# Patient Record
Sex: Male | Born: 1990 | Race: White | Hispanic: No | Marital: Single | Smoking: Former smoker
Health system: Southern US, Community
[De-identification: ages and names within clinical notes are randomized; demographics above are authoritative.]

## PROBLEM LIST (undated history)

## (undated) DIAGNOSIS — F329 Major depressive disorder, single episode, unspecified: Secondary | ICD-10-CM

## (undated) DIAGNOSIS — Z8489 Family history of other specified conditions: Secondary | ICD-10-CM

## (undated) DIAGNOSIS — F32A Depression, unspecified: Secondary | ICD-10-CM

## (undated) HISTORY — PX: VASECTOMY: SHX75

---
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
## 2018-12-07 ENCOUNTER — Encounter (HOSPITAL_COMMUNITY): Payer: Self-pay

## 2018-12-07 ENCOUNTER — Other Ambulatory Visit: Payer: Self-pay

## 2018-12-07 ENCOUNTER — Emergency Department (HOSPITAL_COMMUNITY)
Admission: EM | Admit: 2018-12-07 | Discharge: 2018-12-07 | Disposition: A | Discharge status: Home / Self Care | Payer: Self-pay | Attending: Emergency Medicine | Admitting: Emergency Medicine

## 2018-12-07 DIAGNOSIS — F1910 Other psychoactive substance abuse, uncomplicated: Secondary | ICD-10-CM | POA: Insufficient documentation

## 2018-12-07 DIAGNOSIS — F329 Major depressive disorder, single episode, unspecified: Secondary | ICD-10-CM | POA: Insufficient documentation

## 2018-12-07 DIAGNOSIS — F410 Panic disorder [episodic paroxysmal anxiety] without agoraphobia: Secondary | ICD-10-CM | POA: Insufficient documentation

## 2018-12-07 DIAGNOSIS — R45851 Suicidal ideations: Secondary | ICD-10-CM | POA: Insufficient documentation

## 2018-12-07 DIAGNOSIS — Z046 Encounter for general psychiatric examination, requested by authority: Secondary | ICD-10-CM | POA: Insufficient documentation

## 2018-12-07 DIAGNOSIS — Z87891 Personal history of nicotine dependence: Secondary | ICD-10-CM | POA: Insufficient documentation

## 2018-12-07 HISTORY — DX: Major depressive disorder, single episode, unspecified: F32.9

## 2018-12-07 HISTORY — DX: Depression, unspecified: F32.A

## 2018-12-07 LAB — CBC
HCT: 40.1 % (ref 39.0–52.0)
Hemoglobin: 13.1 g/dL (ref 13.0–17.0)
MCH: 28.7 pg (ref 26.0–34.0)
MCHC: 32.7 g/dL (ref 30.0–36.0)
MCV: 87.7 fL (ref 80.0–100.0)
Platelets: 258 10*3/uL (ref 150–400)
RBC: 4.57 MIL/uL (ref 4.22–5.81)
RDW: 12.1 % (ref 11.5–15.5)
WBC: 7.1 10*3/uL (ref 4.0–10.5)
nRBC: 0 % (ref 0.0–0.2)

## 2018-12-07 LAB — COMPREHENSIVE METABOLIC PANEL
ALT: 26 U/L (ref 0–44)
AST: 36 U/L (ref 15–41)
Albumin: 4.3 g/dL (ref 3.5–5.0)
Alkaline Phosphatase: 67 U/L (ref 38–126)
Anion gap: 10 (ref 5–15)
BUN: 9 mg/dL (ref 6–20)
CO2: 29 mmol/L (ref 22–32)
Calcium: 9.4 mg/dL (ref 8.9–10.3)
Chloride: 100 mmol/L (ref 98–111)
Creatinine, Ser: 0.8 mg/dL (ref 0.61–1.24)
GFR calc Af Amer: >60 mL/min (ref >60)
GFR calc non Af Amer: >60 mL/min (ref >60)
Glucose, Bld: 83 mg/dL (ref 70–99)
Potassium: 3.5 mmol/L (ref 3.5–5.1)
Sodium: 139 mmol/L (ref 135–145)
Total Bilirubin: 0.5 mg/dL (ref 0.3–1.2)
Total Protein: 7.4 g/dL (ref 6.5–8.1)

## 2018-12-07 LAB — RAPID URINE DRUG SCREEN, HOSP PERFORMED
Amphetamines: NOT DETECTED
Barbiturates: NOT DETECTED
Benzodiazepines: POSITIVE — AB
Cocaine: NOT DETECTED
Opiates: NOT DETECTED
Tetrahydrocannabinol: POSITIVE — AB

## 2018-12-07 LAB — ACETAMINOPHEN LEVEL: Acetaminophen (Tylenol), Serum: <10 ug/mL — ABNORMAL LOW (ref 10–30)

## 2018-12-07 LAB — SALICYLATE LEVEL: Salicylate Lvl: <7 mg/dL (ref 2.8–30.0)

## 2018-12-07 LAB — ETHANOL: Alcohol, Ethyl (B): <10 mg/dL (ref <10)

## 2018-12-07 NOTE — ED Notes (Signed)
Patient verbalizes understanding of discharge instructions. Opportunity for questioning and answers were provided. Armband removed by staff, pt discharged from ED ambulatory with a Cab Volcher.

## 2018-12-07 NOTE — ED Provider Notes (Signed)
[facility] Provider Note  CSN: [id number] Arrival date & time: 12/07/18 0003  Chief Complaint(s) Panic Attack and Addiction Problem  HPI Douglas Santiago is a 28 y.o. male    Mental Health Problem  Presenting symptoms: depression and suicidal threats   Presenting symptoms: no aggressive behavior, no agitation, no bizarre behavior, no hallucinations, no homicidal ideas, no paranoid behavior, no self-mutilation and no suicide attempt   Degree of incapacity (severity):  Moderate Onset quality:  Gradual Duration: several months. Timing:  Intermittent Progression:  Waxing and waning Chronicity:  Recurrent Context: alcohol use and drug abuse   Treatment compliance:  Untreated Relieved by:  Nothing Worsened by:  Nothing Associated symptoms: anxiety   Associated symptoms: no anhedonia, not distractible, no euphoric mood, no fatigue, no headaches, no hypersomnia, no insomnia, no irritability, no poor judgment and no school problems   Risk factors: no hx of mental illness and no recent psychiatric admission     Past Medical History Past Medical History:  Diagnosis Date  . Depression    There are no active problems to display for this patient.  Home Medication(s) Prior to Admission medications   Not on File                                                                                                                                    Past Surgical History History reviewed. No pertinent surgical history. Family History No family history on file.  Social History Social History   Tobacco Use  . Smoking status: Former Games developermoker  . Smokeless tobacco: Never Used  Substance Use Topics  . Alcohol use: Yes  . Drug use: Yes   Allergies Patient has no known allergies.  Review of Systems Review of Systems  Constitutional: Negative for fatigue and irritability.  Neurological: Negative for headaches.  Psychiatric/Behavioral: Negative for  agitation, hallucinations, homicidal ideas, paranoia and self-injury. The patient is nervous/anxious. The patient does not have insomnia.    All other systems are reviewed and are negative for acute change except as noted in the HPI  Physical Exam Vital Signs  I have reviewed the triage vital signs BP (!) 160/87   Pulse 93   Temp 98.2 F (36.8 C) (Oral)   Resp 18   SpO2 98%   Physical Exam Vitals signs reviewed.  Constitutional:      General: He is not in acute distress.    Appearance: He is well-developed. He is obese. He is not diaphoretic.  HENT:     Head: Normocephalic and atraumatic.     Jaw: No trismus.     Right Ear: External ear normal.     Left Ear: External ear normal.     Nose: Nose normal.  Eyes:     General: No scleral icterus.    Conjunctiva/sclera: Conjunctivae normal.  Neck:     Musculoskeletal: Normal range of motion.  Trachea: Phonation normal.  Cardiovascular:     Rate and Rhythm: Normal rate and regular rhythm.  Pulmonary:     Effort: Pulmonary effort is normal. No respiratory distress.     Breath sounds: No stridor.  Abdominal:     General: There is no distension.  Musculoskeletal: Normal range of motion.  Neurological:     Mental Status: He is alert and oriented to person, place, and time.  Psychiatric:        Attention and Perception: Attention normal.        Mood and Affect: Mood normal.        Speech: Speech normal.        Behavior: Behavior normal.        Thought Content: Thought content is not paranoid or delusional. Thought content includes suicidal ideation. Thought content does not include homicidal ideation. Thought content does not include homicidal or suicidal plan.     ED Results and Treatments Labs (all labs ordered are listed, but only abnormal results are displayed) Labs Reviewed  ACETAMINOPHEN LEVEL - Abnormal; Notable for the following components:      Result Value   Acetaminophen (Tylenol), Serum <10 (*)    All other  components within normal limits  RAPID URINE DRUG SCREEN, HOSP PERFORMED - Abnormal; Notable for the following components:   Benzodiazepines POSITIVE (*)    Tetrahydrocannabinol POSITIVE (*)    All other components within normal limits  COMPREHENSIVE METABOLIC PANEL  ETHANOL  SALICYLATE LEVEL  CBC                                                                                                                         EKG  EKG Interpretation  Date/Time:  Friday Dec 07 2018 03:01:26 EDT Ventricular Rate:  88 PR Interval:    QRS Duration: 95 QT Interval:  354 QTC Calculation: 429 R Axis:   16 Text Interpretation:  Sinus rhythm Probable left ventricular hypertrophy Borderline T abnormalities, inferior leads NO STEMI. No old tracing to compare Confirmed by Drema Pry [phone]) on 12/07/2018 3:03:26 AM      Radiology No results found. Pertinent labs & imaging results that were available during my care of the patient were reviewed by me and considered in my medical decision making (see chart for details).  Medications Ordered in ED Medications - No data to display  Procedures Procedures  (including critical care time)  Medical Decision Making / ED Course I have reviewed the nursing notes for this encounter and the patient's prior records (if available in EHR or on provided paperwork).    Patient presents with intermittent suicidal ideations in the setting of polysubstance abuse.  Patient reports that he has been trying to cut down his opiate use over the past several months by tapering down.  States that he is to take 160 mg of oxycodone per day.  Now taken approximately 30 mg.  He denies any active suicidal plans.  Denies any homicidal ideations or AVH.  Endorses frequent panic attacks.   Patient has forward thinking and does not appear to be a threat to  himself or others.  Believe he is appropriate for outpatient management.  Will provide him with resources for behavioral health and substance abuse.  The patient appears reasonably screened and/or stabilized for discharge and I doubt any other medical condition or other [facility] requiring further screening, evaluation, or treatment in the ED at this time prior to discharge.  The patient is safe for discharge with strict return precautions.   Final Clinical Impression(s) / ED Diagnoses Final diagnoses:  Panic attack  Polysubstance abuse (HCC)  Suicidal ideation    Disposition: Discharge  Condition: Good  I have discussed the results, Dx and Tx plan with the patient who expressed understanding and agree(s) with the plan. Discharge instructions discussed at great length. The patient was given strict return precautions who verbalized understanding of the instructions. No further questions at time of discharge.    ED Discharge Orders    None       Follow Up: [address] [place] [address] [phone]  Call       This chart was dictated using voice recognition software.  Despite best efforts to proofread,  errors can occur which can change the documentation meaning.   Nira Conn, MD 12/07/18 [phone]

## 2018-12-07 NOTE — ED Triage Notes (Signed)
Pt states that he has been having anxiety attacks since January worse today, pt states that he has been addicted to opioids and taking 150mg  a day and is now trying to taper himself off. Reports that he has not drank in 4 days, normally drink 2-6 beers a day. Reports SI thoughts, denies a certain plan, denies HI, denies AVH

## 2019-09-20 ENCOUNTER — Encounter (HOSPITAL_COMMUNITY): Payer: Self-pay

## 2019-09-20 ENCOUNTER — Emergency Department (HOSPITAL_COMMUNITY): Payer: 59

## 2019-09-20 ENCOUNTER — Other Ambulatory Visit: Payer: Self-pay

## 2019-09-20 ENCOUNTER — Emergency Department (HOSPITAL_COMMUNITY)
Admission: EM | Admit: 2019-09-20 | Discharge: 2019-09-21 | Disposition: A | Discharge status: Home / Self Care | Payer: 59 | Attending: Emergency Medicine | Admitting: Emergency Medicine

## 2019-09-20 DIAGNOSIS — R0789 Other chest pain: Secondary | ICD-10-CM | POA: Insufficient documentation

## 2019-09-20 DIAGNOSIS — Z87891 Personal history of nicotine dependence: Secondary | ICD-10-CM | POA: Insufficient documentation

## 2019-09-20 DIAGNOSIS — R079 Chest pain, unspecified: Secondary | ICD-10-CM

## 2019-09-20 LAB — CBC
HCT: 48.4 % (ref 39.0–52.0)
Hemoglobin: 15.8 g/dL (ref 13.0–17.0)
MCH: 29 pg (ref 26.0–34.0)
MCHC: 32.6 g/dL (ref 30.0–36.0)
MCV: 89 fL (ref 80.0–100.0)
Platelets: 277 10*3/uL (ref 150–400)
RBC: 5.44 MIL/uL (ref 4.22–5.81)
RDW: 13.2 % (ref 11.5–15.5)
WBC: 6.4 10*3/uL (ref 4.0–10.5)
nRBC: 0 % (ref 0.0–0.2)

## 2019-09-20 LAB — BASIC METABOLIC PANEL
Anion gap: 11 (ref 5–15)
BUN: 10 mg/dL (ref 6–20)
CO2: 25 mmol/L (ref 22–32)
Calcium: 9.8 mg/dL (ref 8.9–10.3)
Chloride: 103 mmol/L (ref 98–111)
Creatinine, Ser: 0.69 mg/dL (ref 0.61–1.24)
GFR calc Af Amer: >60 mL/min (ref >60)
GFR calc non Af Amer: >60 mL/min (ref >60)
Glucose, Bld: 130 mg/dL — ABNORMAL HIGH (ref 70–99)
Potassium: 3.9 mmol/L (ref 3.5–5.1)
Sodium: 139 mmol/L (ref 135–145)

## 2019-09-20 LAB — TROPONIN I (HIGH SENSITIVITY): Troponin I (High Sensitivity): <2 ng/L (ref <18)

## 2019-09-20 NOTE — ED Triage Notes (Signed)
Pt reports brief episode of sharp chest pain that radiated to his right side of his back,lasting a few seconds. This episode happened while lifting a heavy box. Pt anxious during triage.

## 2019-09-20 NOTE — ED Provider Notes (Signed)
[facility] EMERGENCY DEPARTMENT Provider Note   CSN: [id number] Arrival date & time: 09/20/19  1226     History Chief Complaint  Patient presents with  . Chest Pain    Douglas Santiago is a 29 y.o. male.  HPI Patient with chest pain.  States he was at work where he been lifting heavy things.  He said not right when he lifted it but shortly after developed a sharp pain that started in his chest and went down to his back.  Lasted a few seconds and improved.  Patient nervous and does have a history anxiety.  Worried about his heart.  States he has not had pains like this before.  Former smoker.  Also history of opiate use/abuse, however states he never injected.  No fevers.  No trauma.  No numbness or weakness.  Feels better now.  No shortness of breath.    Past Medical History:  Diagnosis Date  . Depression     There are no problems to display for this patient.   History reviewed. No pertinent surgical history.     No family history on file.  Social History   Tobacco Use  . Smoking status: Former Games developer  . Smokeless tobacco: Never Used  Substance Use Topics  . Alcohol use: Yes  . Drug use: Yes    Home Medications Prior to Admission medications   Not on File    Allergies    Patient has no known allergies.  Review of Systems   Review of Systems  Constitutional: Negative for appetite change.  HENT: Negative for congestion.   Respiratory: Negative for shortness of breath.   Cardiovascular: Positive for chest pain.  Gastrointestinal: Negative for abdominal pain.  Genitourinary: Negative for flank pain.  Musculoskeletal: Negative for back pain.  Skin: Negative for rash.  Neurological: Negative for weakness.  Psychiatric/Behavioral: Negative for confusion.    Physical Exam Updated Vital Signs BP (!) 161/94 (BP Location: Right Arm)   Pulse 98   Temp 98.4 F (36.9 C) (Oral)   Resp 18   SpO2 99%   Physical Exam Vitals reviewed.  HENT:    Head: Normocephalic.  Cardiovascular:     Rate and Rhythm: Normal rate.  Pulmonary:     Effort: Pulmonary effort is normal.  Chest:     Chest wall: No tenderness.  Abdominal:     Tenderness: There is no abdominal tenderness.  Musculoskeletal:     Cervical back: Neck supple.     Comments: No cervical thoracic or lumbar tenderness.  Skin:    General: Skin is warm.     Capillary Refill: Capillary refill takes less than 2 seconds.  Neurological:     Mental Status: He is alert and oriented to person, place, and time.     ED Results / Procedures / Treatments   Labs (all labs ordered are listed, but only abnormal results are displayed) Labs Reviewed  BASIC METABOLIC PANEL - Abnormal; Notable for the following components:      Result Value   Glucose, Bld 130 (*)    All other components within normal limits  CBC  TROPONIN I (HIGH SENSITIVITY)  TROPONIN I (HIGH SENSITIVITY)    EKG EKG Interpretation  Date/Time:  Friday September 20 2019 13:16:16 EST Ventricular Rate:  99 PR Interval:    QRS Duration: 97 QT Interval:  314 QTC Calculation: 403 R Axis:   55 Text Interpretation: Sinus rhythm Confirmed by Benjiman Core [phone]) on 09/20/2019 2:19:24 PM  Radiology DG Chest 2 View  Result Date: 09/20/2019 CLINICAL DATA:  Chest pain. EXAM: CHEST - 2 VIEW COMPARISON:  No prior. FINDINGS: Mild prominence of the anterior mediastinum most likely prominent mediastinal fat. Hilar structures normal. Lungs are clear. No pleural effusion or pneumothorax. Heart size normal. Thoracic spine scoliosis. Mild lower thoracic vertebral body compression fractures, age undetermined. IMPRESSION: 1.  No acute cardiopulmonary disease. 2. Mild lower thoracic vertebral body compression fractures, age undetermined. Electronically Signed   By: Marcello Moores  Register   On: 09/20/2019 13:06    Procedures Procedures (including critical care time)  Medications Ordered in ED Medications - No data to display   ED Course  I have reviewed the triage vital signs and the nursing notes.  Pertinent labs & imaging results that were available during my care of the patient were reviewed by me and considered in my medical decision making (see chart for details).    MDM Rules/Calculators/A&P                      Patient with acute sharp chest pain.  Resolved.  EKG reassuring.  X-ray showed potentially thoracic compression fractures of undetermined age.  He is not tender at this time I do not think these are acute.  Patient does not have memory of a fall or any injury that could have caused back injury.  Will discharge home with outpatient follow-up. Final Clinical Impression(s) / ED Diagnoses Final diagnoses:  Nonspecific chest pain    Rx / DC Orders ED Discharge Orders    None       Davonna Belling, MD 09/20/19 1610

## 2019-09-20 NOTE — Discharge Instructions (Addendum)
Follow-up with your primary care doctor as needed

## 2019-09-30 ENCOUNTER — Ambulatory Visit: Payer: 59 | Admitting: Registered"

## 2020-11-26 IMAGING — DX DG CHEST 2V
2 series · 2 of 2 positions shown · non-contrast
Comparison: No prior.

CLINICAL DATA: Chest pain.

EXAM:
CHEST - 2 VIEW

[w chest pa]
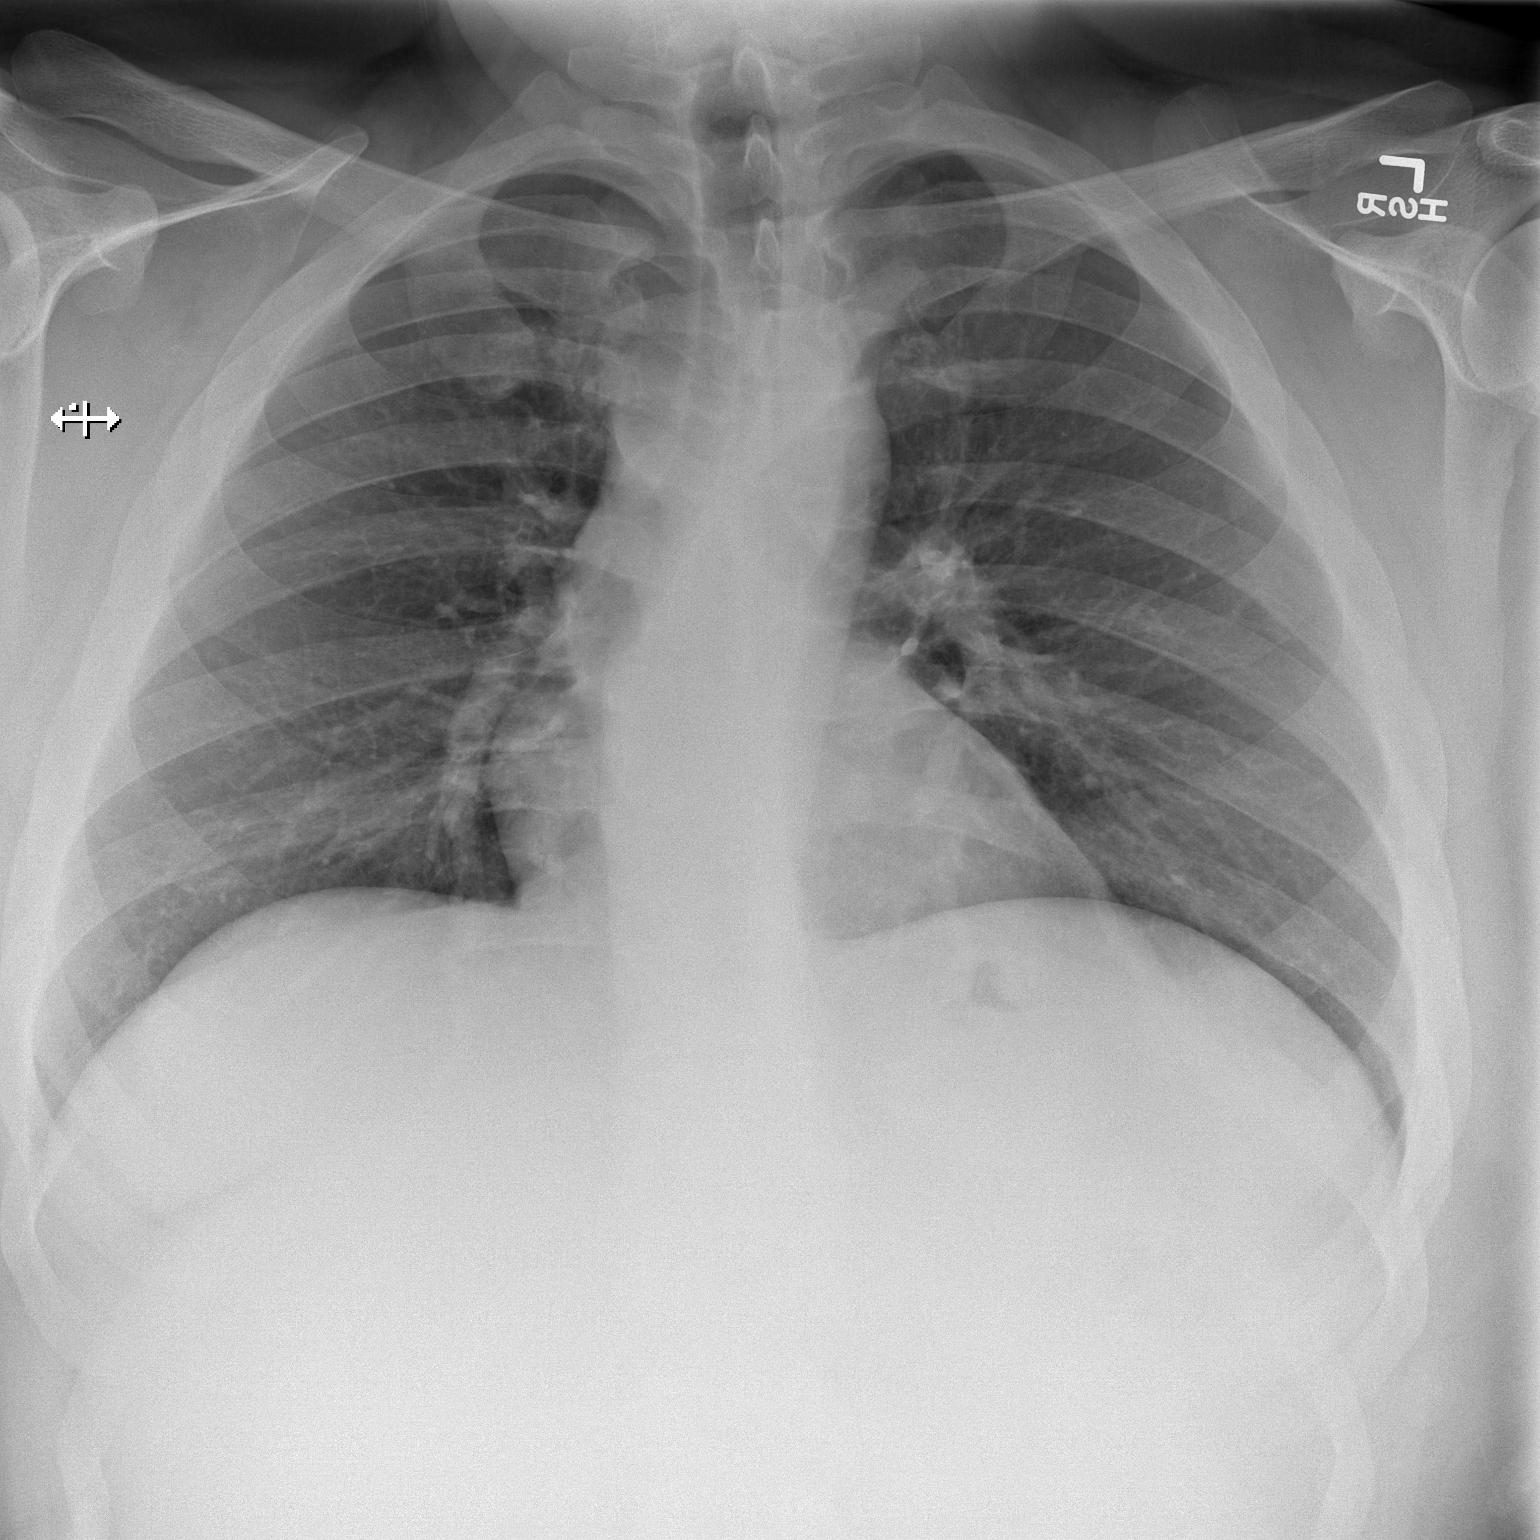

[w chest lat]
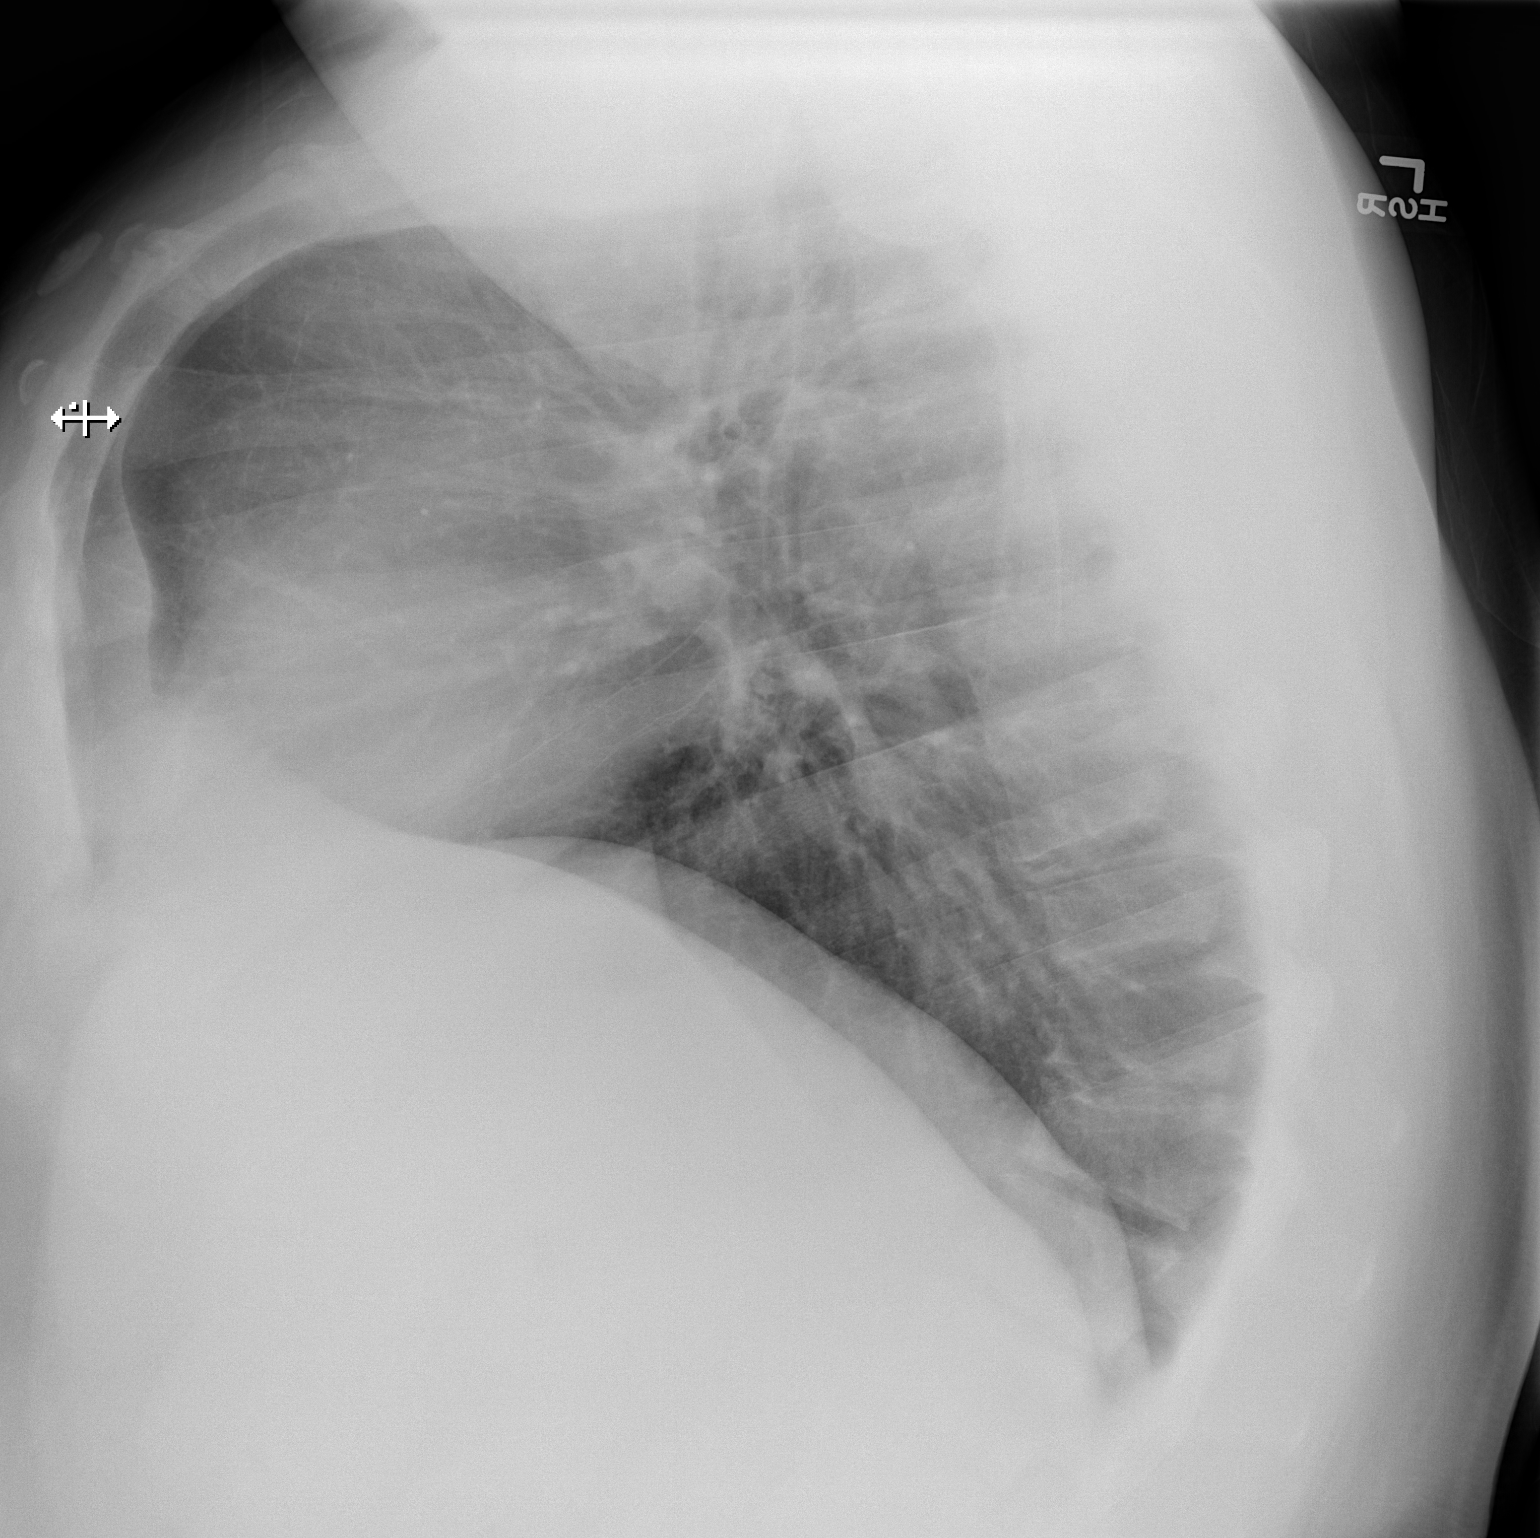

[2 of 2 positions shown; findings below may reference images not displayed]

FINDINGS: Mild prominence of the anterior mediastinum most likely prominent
mediastinal fat. Hilar structures normal. Lungs are clear. No
pleural effusion or pneumothorax. Heart size normal. Thoracic spine
scoliosis. Mild lower thoracic vertebral body compression fractures,
age undetermined.
IMPRESSION: 1.  No acute cardiopulmonary disease.

2. Mild lower thoracic vertebral body compression fractures, age
undetermined.

## 2022-06-03 ENCOUNTER — Other Ambulatory Visit: Payer: Self-pay | Admitting: Family

## 2022-06-03 DIAGNOSIS — R221 Localized swelling, mass and lump, neck: Secondary | ICD-10-CM

## 2023-03-02 ENCOUNTER — Other Ambulatory Visit: Payer: Self-pay | Admitting: Physician Assistant

## 2023-03-02 DIAGNOSIS — R221 Localized swelling, mass and lump, neck: Secondary | ICD-10-CM

## 2023-03-13 ENCOUNTER — Other Ambulatory Visit: Payer: 59

## 2023-03-14 ENCOUNTER — Ambulatory Visit
Admission: RE | Admit: 2023-03-14 | Discharge: 2023-03-14 | Disposition: A | Discharge status: Home / Self Care | Payer: No Typology Code available for payment source | Source: Ambulatory Visit | Attending: Physician Assistant | Admitting: Physician Assistant

## 2023-03-14 DIAGNOSIS — R221 Localized swelling, mass and lump, neck: Secondary | ICD-10-CM

## 2023-06-08 ENCOUNTER — Other Ambulatory Visit: Payer: Self-pay | Admitting: Urology

## 2023-06-09 ENCOUNTER — Other Ambulatory Visit: Payer: Self-pay

## 2023-06-09 ENCOUNTER — Encounter (HOSPITAL_BASED_OUTPATIENT_CLINIC_OR_DEPARTMENT_OTHER): Payer: Self-pay | Admitting: Urology

## 2023-06-09 NOTE — Progress Notes (Signed)
Spoke w/ via phone for pre-op interview: patient  Lab needs dos: NA Lab results:  none COVID test: patient states asymptomatic no test needed. Arrive at 0645am 06/15/23 NPO after MN except clear liquids. Clear liquids from MN until 0545 AM Med rec completed. Medications to take morning of surgery: Wellbutrin only Diabetic medication: NA Patient instructed no nail polish to be worn day of surgery. Patient instructed to bring photo id and insurance card day of surgery. Patient aware to have driver (ride ) / caregiver for 24 hours after surgery. Partner, Irving Burton, to drive. Special Instructions: NA Patient verbalized understanding of instructions that were given at this phone interview. Patient denies shortness of breath, chest pain, fever, cough at this phone interview.

## 2023-06-15 ENCOUNTER — Ambulatory Visit (HOSPITAL_BASED_OUTPATIENT_CLINIC_OR_DEPARTMENT_OTHER)
Admission: RE | Admit: 2023-06-15 | Payer: No Typology Code available for payment source | Source: Home / Self Care | Admitting: Urology

## 2023-06-15 DIAGNOSIS — Z01818 Encounter for other preprocedural examination: Secondary | ICD-10-CM

## 2023-06-15 HISTORY — DX: Family history of other specified conditions: Z84.89

## 2023-06-15 SURGERY — VASECTOMY
Anesthesia: General | Laterality: Left

## 2023-12-07 ENCOUNTER — Encounter (HOSPITAL_COMMUNITY): Payer: Self-pay

## 2023-12-07 ENCOUNTER — Ambulatory Visit (HOSPITAL_COMMUNITY): Admission: EM | Admit: 2023-12-07 | Discharge: 2023-12-07 | Disposition: A | Discharge status: Home / Self Care

## 2023-12-07 ENCOUNTER — Emergency Department (HOSPITAL_COMMUNITY)

## 2023-12-07 ENCOUNTER — Other Ambulatory Visit: Payer: Self-pay

## 2023-12-07 ENCOUNTER — Emergency Department (HOSPITAL_COMMUNITY)
Admission: EM | Admit: 2023-12-07 | Discharge: 2023-12-08 | Discharge status: Against Medical Advice | Attending: Emergency Medicine | Admitting: Emergency Medicine

## 2023-12-07 DIAGNOSIS — G4485 Primary stabbing headache: Secondary | ICD-10-CM | POA: Diagnosis not present

## 2023-12-07 DIAGNOSIS — Z5321 Procedure and treatment not carried out due to patient leaving prior to being seen by health care provider: Secondary | ICD-10-CM | POA: Insufficient documentation

## 2023-12-07 DIAGNOSIS — R110 Nausea: Secondary | ICD-10-CM | POA: Diagnosis not present

## 2023-12-07 DIAGNOSIS — R519 Headache, unspecified: Secondary | ICD-10-CM | POA: Diagnosis not present

## 2023-12-07 DIAGNOSIS — R200 Anesthesia of skin: Secondary | ICD-10-CM | POA: Diagnosis present

## 2023-12-07 DIAGNOSIS — R202 Paresthesia of skin: Secondary | ICD-10-CM | POA: Diagnosis not present

## 2023-12-07 LAB — CBC WITH DIFFERENTIAL/PLATELET
Abs Immature Granulocytes: 0.02 10*3/uL (ref 0.00–0.07)
Basophils Absolute: 0 10*3/uL (ref 0.0–0.1)
Basophils Relative: 1 %
Eosinophils Absolute: 0.2 10*3/uL (ref 0.0–0.5)
Eosinophils Relative: 2 %
HCT: 46.2 % (ref 39.0–52.0)
Hemoglobin: 15 g/dL (ref 13.0–17.0)
Immature Granulocytes: 0 %
Lymphocytes Relative: 26 %
Lymphs Abs: 1.9 10*3/uL (ref 0.7–4.0)
MCH: 28.6 pg (ref 26.0–34.0)
MCHC: 32.5 g/dL (ref 30.0–36.0)
MCV: 88 fL (ref 80.0–100.0)
Monocytes Absolute: 0.6 10*3/uL (ref 0.1–1.0)
Monocytes Relative: 8 %
Neutro Abs: 4.5 10*3/uL (ref 1.7–7.7)
Neutrophils Relative %: 63 %
Platelets: 279 10*3/uL (ref 150–400)
RBC: 5.25 MIL/uL (ref 4.22–5.81)
RDW: 12.7 % (ref 11.5–15.5)
WBC: 7.2 10*3/uL (ref 4.0–10.5)
nRBC: 0 % (ref 0.0–0.2)

## 2023-12-07 LAB — BASIC METABOLIC PANEL WITH GFR
Anion gap: 10 (ref 5–15)
BUN: 18 mg/dL (ref 6–20)
CO2: 23 mmol/L (ref 22–32)
Calcium: 9.5 mg/dL (ref 8.9–10.3)
Chloride: 102 mmol/L (ref 98–111)
Creatinine, Ser: 1.03 mg/dL (ref 0.61–1.24)
GFR, Estimated: >60 mL/min (ref >60)
Glucose, Bld: 95 mg/dL (ref 70–99)
Potassium: 4.2 mmol/L (ref 3.5–5.1)
Sodium: 135 mmol/L (ref 135–145)

## 2023-12-07 LAB — MAGNESIUM: Magnesium: 1.9 mg/dL (ref 1.7–2.4)

## 2023-12-07 NOTE — Discharge Instructions (Signed)
 Please go to ER today to have further work up

## 2023-12-07 NOTE — ED Triage Notes (Signed)
 Patient reports that he was working in [employer] and developed left leg numbness and a headache. Patient states he had slight nausea that did not last long.  Patient states the leg numbness stopped after 2-3 minutes, but is still having headache.  Patient has a history of aura migraine and has been to a physician for this, but not the leg numbness.

## 2023-12-07 NOTE — ED Notes (Signed)
 Patient is being discharged from the Urgent Care and sent to the Emergency Department via personal opperated vehicle . Per Lamond Pilot Rodriguez-Southworth PA-C, patient is in need of higher level of care due to Migraine and numbness. Patient is aware and verbalizes understanding of plan of care.  Vitals:   12/07/23 1605  BP: (!) 140/78  Pulse: 82  Resp: 16  Temp: 97.8 F (36.6 C)  SpO2: 97%

## 2023-12-07 NOTE — ED Provider Notes (Signed)
 [facility]    CSN: [id number] Arrival date & time: 12/07/23  1541      History   Chief Complaint Chief Complaint  Patient presents with   leg numbness   Headache    HPI Douglas Santiago is a 33 y.o. 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 The Head pain comes and goes since and does not feel like his usual aura migraine. He describes it as "ice pick "sharp pain which he has had in the past more of a sharp pain, but has never had head scan. So while standing he grabbed his leg kind as he needed to wake it up, and after 2-3 minutes the numbness resolved( without weakness) and the HA has not resolved. He felt anxious while dealing with the leg numbness which has happed  in the past year once. At that time he did not have it evaluated.     Past Medical History:  Diagnosis Date   Depression    Family history of adverse reaction to anesthesia    life threatening hypotension or bradycardia per pt    There are no active problems to display for this patient.   Past Surgical History:  Procedure Laterality Date   VASECTOMY N/A        Home Medications    Prior to Admission medications   Medication Sig Start Date End Date Taking? Authorizing Provider  buPROPion (WELLBUTRIN XL) 300 MG 24 hr tablet Take 300 mg by mouth daily.    [provider]    Family History Family History  Problem Relation Age of Onset   Cancer Father    Heart failure Father     Social History Social History   Tobacco Use   Smoking status: Former   Smokeless tobacco: Never  Vaping Use   Vaping status: Every Day   Substances: Nicotine  Substance Use Topics   Alcohol use: Not Currently   Drug use: Not Currently     Allergies   Patient has no known allergies.   Review of Systems Review of Systems As noted in HPI  Physical Exam Triage Vital  Signs ED Triage Vitals  Encounter Vitals Group     BP 12/07/23 1605 (!) 140/78     Systolic BP Percentile --      Diastolic BP Percentile --      Pulse Rate 12/07/23 1605 82     Resp 12/07/23 1605 16     Temp 12/07/23 1605 97.8 F (36.6 C)     Temp Source 12/07/23 1605 Oral     SpO2 12/07/23 1605 97 %     Weight --      Height --      Head Circumference --      Peak Flow --      Pain Score 12/07/23 1603 8     Pain Loc --      Pain Education --      Exclude from Growth Chart --    No data found.  Updated Vital Signs BP (!) 140/78 (BP Location: Right Arm)   Pulse 82   Temp 97.8 F (36.6 C) (Oral)   Resp 16   SpO2 97%   Visual Acuity Right Eye Distance:   Left Eye Distance:   Bilateral Distance:    Right Eye Near:   Left Eye Near:  Bilateral Near:     Physical Exam Physical Exam Vitals signs and nursing note reviewed.  Constitutional:      General: He is not in acute distress.    Appearance: He is well-developed and normal weight. He is not ill-appearing, toxic-appearing or diaphoretic.  HENT:     Head: Normocephalic.  Eyes:     Extraocular Movements: Extraocular movements intact.     Pupils: Pupils are equal, round, and reactive to light.  Neck:     Musculoskeletal: Neck supple. No neck rigidity.     Meningeal: Brudzinski's sign absent.  Cardiovascular:     Rate and Rhythm: Normal rate and regular rhythm.     Heart sounds: No murmur.  Pulmonary:     Effort: Pulmonary effort is normal.     Breath sounds: Normal breath sounds. No wheezing, rhonchi or rales.    Musculoskeletal: Normal range of motion.  Lymphadenopathy:     Cervical: No cervical adenopathy.  Skin:    General: Skin is warm and dry.  Neurological: no facial asymmetry noted    Mental Status: He is alert.     Cranial Nerves: No cranial nerve deficit or facial asymmetry.     Sensory: No sensory deficit.     Motor: No weakness.     Coordination: Romberg sign negative. Coordination normal.      Gait: Gait normal.     Deep Tendon Reflexes: Reflexes normal.     Comments: Normal Romberg,  finger to nose, tandem gait.   Psychiatric:        Mood and Affect: Mood normal.        Speech: Speech normal.        Behavior: Behavior normal.    UC Treatments / Results  Labs (all labs ordered are listed, but only abnormal results are displayed) Labs Reviewed - No data to display  EKG   Radiology No results found.  Procedures Procedures (including critical care time)  Medications Ordered in UC Medications - No data to display  Initial Impression / Assessment and Plan / UC Course  I have reviewed the triage vital signs and the nursing notes.  Sudden L leg paresthesia and HA at the same time, with resolved leg paresthesia but unresolved HA.   He was sent to ER for further work up.  Final Clinical Impressions(s) / UC Diagnoses   Final diagnoses:  Primary stabbing headache  Paresthesia of left leg     Discharge Instructions      Please go to ER today to have further work up    ED Prescriptions   None    PDMP not reviewed this encounter.   Vonda Guadeloupe, PA-C 12/07/23 1641

## 2023-12-07 NOTE — ED Triage Notes (Signed)
 Pt sent from urgent care for further evaluation of left leg numbness lasting 2-3 minutes and HA; has associated nausea that has ceased; endorses continued dull HA; denies other neuro deficits at this time  Verbal consent given for mse

## 2023-12-08 ENCOUNTER — Ambulatory Visit (HOSPITAL_COMMUNITY): Admission: EM | Admit: 2023-12-08 | Discharge: 2023-12-08 | Disposition: A | Discharge status: Home / Self Care

## 2023-12-08 ENCOUNTER — Encounter (HOSPITAL_COMMUNITY): Payer: Self-pay

## 2023-12-08 DIAGNOSIS — Z712 Person consulting for explanation of examination or test findings: Secondary | ICD-10-CM

## 2023-12-08 NOTE — ED Triage Notes (Signed)
 Patient states was seen yesterday and wanted to go over results from that visit. States he saw on mychart that it said TIA (mini stroke) in the diagnosis of his imaging. Patient was seen here at the urgent care and then referred to the ER for higher level of care. There was a CT done but the Patient left after waiting for 5 hours.   Patient states having a mild headache but thinks it may be stress. Headache from yesterday has gotten better. Denies any other symptoms.

## 2023-12-08 NOTE — Discharge Instructions (Addendum)
  1. Encounter to discuss test results (Primary) - Ambulatory referral to Neurology for follow up evaluation following TIA-like symptoms. - CT head reviewed from ER, confirm no acute intracranial processes identified.  Normal head CT. -Continue to monitor symptoms for any change in severity if there is any escalation of current symptoms or development of new symptoms follow-up in ER for further evaluation and management.

## 2023-12-08 NOTE — ED Provider Notes (Signed)
 [facility]  Note:  This document was prepared using Dragon voice recognition software and may include unintentional dictation errors.  MRN: [id number] DOB: [birth year]  Subjective:   Douglas Santiago is a 33 y.o. male presenting for discussion of CT head results from yesterday after ER visit.  Patient reports that he was seen here in urgent care and sent to ER for a CT head due to TIA-like symptoms.  Patient reports he had the CT performed but did not get results because the wait was too long and decided to go home.  Patient reports that all symptoms have resolved at this time.  Was concerned when he read "TIA" on the CT results on MyChart.  Patient states that when he was at work he started getting a severe headache that came on very quickly and states that his left leg "stopped working".  Patient denies ever having any previous symptoms like this.  No chest pain, shortness of breath, weakness, dizziness, blurred vision, altered mental status at this time.  No current facility-administered medications for this encounter.  Current Outpatient Medications:    buPROPion (WELLBUTRIN XL) 300 MG 24 hr tablet, Take 300 mg by mouth daily., Disp: , Rfl:    No Known Allergies  Past Medical History:  Diagnosis Date   Depression    Family history of adverse reaction to anesthesia    life threatening hypotension or bradycardia per pt     Past Surgical History:  Procedure Laterality Date   VASECTOMY N/A     Family History  Problem Relation Age of Onset   Cancer Father    Heart failure Father     Social History   Tobacco Use   Smoking status: Former   Smokeless tobacco: Never  Vaping Use   Vaping status: Every Day   Substances: Nicotine  Substance Use Topics   Alcohol use: Not Currently   Drug use: Not Currently    ROS Refer to HPI for ROS details.  Objective:   Vitals: BP (!) 140/98 (BP Location: Left Arm)   Pulse 79   Temp 98.2 F (36.8 C) (Oral)   Resp 16    SpO2 98%   Physical Exam Vitals and nursing note reviewed.  Constitutional:      General: He is not in acute distress.    Appearance: Normal appearance. He is well-developed. He is not ill-appearing, toxic-appearing or diaphoretic.  HENT:     Head: Normocephalic and atraumatic.     Nose: Nose normal.     Mouth/Throat:     Mouth: Mucous membranes are moist.     Pharynx: Oropharynx is clear.  Eyes:     General:        Right eye: No discharge.        Left eye: No discharge.     Extraocular Movements: Extraocular movements intact.     Conjunctiva/sclera: Conjunctivae normal.     Pupils: Pupils are equal, round, and reactive to light.  Cardiovascular:     Rate and Rhythm: Normal rate.  Pulmonary:     Effort: Pulmonary effort is normal. No respiratory distress.  Musculoskeletal:        General: Normal range of motion.  Skin:    General: Skin is warm and dry.  Neurological:     General: No focal deficit present.     Mental Status: He is alert and oriented to person, place, and time.     Cranial Nerves: No cranial nerve deficit.     Sensory:  No sensory deficit.     Motor: No weakness.     Coordination: Coordination normal.     Gait: Gait normal.  Psychiatric:        Mood and Affect: Mood normal.        Behavior: Behavior normal.     Procedures  Results for orders placed or performed during the hospital encounter of 12/07/23 (from the past 24 hours)  CBC with Differential     Status: None   Collection Time: 12/07/23  6:39 PM  Result Value Ref Range   WBC 7.2 4.0 - 10.5 K/uL   RBC 5.25 4.22 - 5.81 MIL/uL   Hemoglobin 15.0 13.0 - 17.0 g/dL   HCT 01.0 27.2 - 53.6 %   MCV 88.0 80.0 - 100.0 fL   MCH 28.6 26.0 - 34.0 pg   MCHC 32.5 30.0 - 36.0 g/dL   RDW 64.4 03.4 - 74.2 %   Platelets 279 150 - 400 K/uL   nRBC 0.0 0.0 - 0.2 %   Neutrophils Relative % 63 %   Neutro Abs 4.5 1.7 - 7.7 K/uL   Lymphocytes Relative 26 %   Lymphs Abs 1.9 0.7 - 4.0 K/uL   Monocytes Relative 8 %    Monocytes Absolute 0.6 0.1 - 1.0 K/uL   Eosinophils Relative 2 %   Eosinophils Absolute 0.2 0.0 - 0.5 K/uL   Basophils Relative 1 %   Basophils Absolute 0.0 0.0 - 0.1 K/uL   Immature Granulocytes 0 %   Abs Immature Granulocytes 0.02 0.00 - 0.07 K/uL  Basic metabolic panel     Status: None   Collection Time: 12/07/23  6:39 PM  Result Value Ref Range   Sodium 135 135 - 145 mmol/L   Potassium 4.2 3.5 - 5.1 mmol/L   Chloride 102 98 - 111 mmol/L   CO2 23 22 - 32 mmol/L   Glucose, Bld 95 70 - 99 mg/dL   BUN 18 6 - 20 mg/dL   Creatinine, Ser 5.95 0.61 - 1.24 mg/dL   Calcium 9.5 8.9 - 63.8 mg/dL   GFR, Estimated >75 >64 mL/min   Anion gap 10 5 - 15  Magnesium     Status: None   Collection Time: 12/07/23  6:39 PM  Result Value Ref Range   Magnesium 1.9 1.7 - 2.4 mg/dL    CT Head Wo Contrast Result Date: 12/07/2023 CLINICAL DATA:  Transient ischemic attack. Left lower extremity numbness and sharp headache in the parietal region. EXAM: CT HEAD WITHOUT CONTRAST TECHNIQUE: Contiguous axial images were obtained from the base of the skull through the vertex without intravenous contrast. RADIATION DOSE REDUCTION: This exam was performed according to the departmental dose-optimization program which includes automated exposure control, adjustment of the mA and/or kV according to patient size and/or use of iterative reconstruction technique. COMPARISON:  None Available. FINDINGS: Brain: No intracranial hemorrhage, mass effect, or evidence of acute infarct. No hydrocephalus. No extra-axial fluid collection. Vascular: No hyperdense vessel or unexpected calcification. Skull: No fracture or focal lesion. Sinuses/Orbits: No acute finding. Other: None. IMPRESSION: No acute intracranial abnormality. Electronically Signed   By: Rozell Cornet M.D.   On: 12/07/2023 19:50     Assessment and Plan :     Discharge Instructions       1. Encounter to discuss test results (Primary) - Ambulatory referral to  Neurology for follow up evaluation following TIA-like symptoms. - CT head reviewed from ER, confirm no acute intracranial processes identified.  Normal head CT. -  Continue to monitor symptoms for any change in severity if there is any escalation of current symptoms or development of new symptoms follow-up in ER for further evaluation and management.    Denyse Fillion B Icyss Skog   Wilburt Messina, [place], [place] 12/08/23 [phone]

## 2023-12-08 NOTE — ED Notes (Signed)
 Pt left due to wait time - will get scans in mychart and see pcp

## 2023-12-12 ENCOUNTER — Encounter: Payer: Self-pay | Admitting: Neurology

## 2023-12-26 ENCOUNTER — Encounter: Payer: Self-pay | Admitting: Neurology

## 2023-12-26 ENCOUNTER — Ambulatory Visit: Admitting: Neurology

## 2023-12-26 DIAGNOSIS — Z029 Encounter for administrative examinations, unspecified: Secondary | ICD-10-CM

## 2024-01-23 ENCOUNTER — Encounter: Payer: Self-pay | Admitting: Neurology

## 2024-01-23 ENCOUNTER — Ambulatory Visit (INDEPENDENT_AMBULATORY_CARE_PROVIDER_SITE_OTHER): Admitting: Neurology

## 2024-01-23 VITALS — BP 149/92 | HR 92 | Ht 78.0 in | Wt 331.0 lb

## 2024-01-23 DIAGNOSIS — G44209 Tension-type headache, unspecified, not intractable: Secondary | ICD-10-CM

## 2024-01-23 DIAGNOSIS — G5712 Meralgia paresthetica, left lower limb: Secondary | ICD-10-CM

## 2024-01-23 NOTE — Progress Notes (Signed)
 [facility] Clinic Note - Initial Visit   Date: 01/23/2024   Douglas Santiago MRN: [id number] DOB: 10-12-1990   Dear Ethel Aguas, NP:  Thank you for your kind referral of Douglas Santiago for consultation of left foot numbness. Although his history is well known to you, please allow us  to reiterate it for the purpose of our medical record. The patient was accompanied to the clinic by self.    Douglas Santiago is a 33 y.o. 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  - avoid wearing tight belts  Tension headaches  - OK to take ibuprofen, limit to twice per week.  Return to clinic as needed  ------------------------------------------------------------- History of present illness: On 5/8, he was working and recalls being under a lot of stress.  During his shift, he developed sudden onset of numbness over the left lateral thigh, which lasted 1-2 minutes.  There was no weakness or numbness involving the face or arm.  He became worried by his symptoms which exacerbated his anxiety.   He also recalls having a headache around the same time which lasted 1.5 hour.  He went to he ER where CT head was negative.  He has not had numbness occur again.  He endorses wearing tight belt with thin strap to help with his posture.   He reports having increase frequency of headaches, which tend to be stress induced.  Pain is dull and achy.  He takes ibuprofen about twice per week which helps. He also has history of ocular migraine.     He works as a Investment banker, operational at [employer].  Nonsmoker or drink alcohol.   Out-side paper records, electronic medical record, and images have been reviewed where available and summarized as:  CT head wo contrast 12/07/2023:  Unremarkable   Past Medical History:  Diagnosis Date   Depression    Family  history of adverse reaction to anesthesia    life threatening hypotension or bradycardia per pt    Past Surgical History:  Procedure Laterality Date   VASECTOMY N/A      Medications:  Outpatient Encounter Medications as of 01/23/2024  Medication Sig   aspirin EC 81 MG tablet Take 81 mg by mouth daily. Swallow whole. (Patient taking differently: Take 81 mg by mouth as needed. Swallow whole.)   buPROPion (WELLBUTRIN XL) 300 MG 24 hr tablet Take 300 mg by mouth daily.   ibuprofen (ADVIL) 200 MG tablet Take 200 mg by mouth.   No facility-administered encounter medications on file as of 01/23/2024.    Allergies: No Known Allergies  Family History: Family History  Problem Relation Age of Onset   Cancer Father    Heart failure Father     Social History: Social History   Tobacco Use   Smoking status: Former   Smokeless tobacco: Never  Advertising account planner   Vaping status: Every Day   Substances: Nicotine  Substance Use Topics   Alcohol use: Not Currently   Drug use: Not Currently   Social History   Social History Narrative   Are you right handed or left handed? Right Handed   Are you currently employed ? Yes   What is your current occupation?   Do you live at home alone? No    Who lives with you? Roommates    What type of home do you live in: 1 story or  2 story? One story home         Vital Signs:  BP (!) 149/92   Pulse 92   Ht 6' 6 (1.981 m)   Wt (!) 331 lb (150.1 kg)   SpO2 99%   BMI 38.25 kg/m   Neurological Exam: MENTAL STATUS including orientation to time, place, person, recent and remote memory, attention span and concentration, language, and fund of knowledge is normal.  Speech is not dysarthric.  CRANIAL NERVES: II:  No visual field defects.     III-IV-VI: Pupils equal round and reactive to light.  Normal conjugate, extra-ocular eye movements in all directions of gaze.  No nystagmus.  No ptosis.   V:  Normal facial sensation.    VII:  Normal facial symmetry  and movements.   VIII:  Normal hearing and vestibular function.   IX-X:  Normal palatal movement.   XI:  Normal shoulder shrug and head rotation.   XII:  Normal tongue strength and range of motion, no deviation or fasciculation.  MOTOR:  Motor strength is 5/5 throughout. No atrophy, fasciculations or abnormal movements.  No pronator drift.   MSRs:                                           Right        Left brachioradialis 2+  2+  biceps 2+  2+  triceps 2+  2+  patellar 2+  2+  ankle jerk 2+  2+   SENSORY:  Normal and symmetric perception of light touch, pinprick, vibration, and temperature.   COORDINATION/GAIT: Normal finger-to- nose-finger.  Intact rapid alternating movements bilaterally.  Gait narrow based and stable. Tandem and stressed gait intact.      Thank you for allowing me to participate in patient's care.  If I can answer any additional questions, I would be pleased to do so.    Sincerely,    Raeshaun Simson K. Tobie, DO

## 2024-01-23 NOTE — Patient Instructions (Signed)
 Try to avoid wearing tight belts

## 2024-02-20 ENCOUNTER — Ambulatory Visit: Admitting: Neurology
